# Patient Record
Sex: Female | Born: 2000
Health system: Southern US, Community
[De-identification: ages and names within clinical notes are randomized; demographics above are authoritative.]

## PROBLEM LIST (undated history)

## (undated) DIAGNOSIS — F329 Major depressive disorder, single episode, unspecified: Secondary | ICD-10-CM

## (undated) DIAGNOSIS — F32A Depression, unspecified: Secondary | ICD-10-CM

---
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
## 2021-01-12 ENCOUNTER — Emergency Department (HOSPITAL_COMMUNITY)
Admission: EM | Admit: 2021-01-12 | Discharge: 2021-01-12 | Disposition: A | Discharge status: Home / Self Care | Payer: Managed Care, Other (non HMO) | Attending: Emergency Medicine | Admitting: Emergency Medicine

## 2021-01-12 ENCOUNTER — Encounter (HOSPITAL_COMMUNITY): Payer: Self-pay | Admitting: *Deleted

## 2021-01-12 ENCOUNTER — Emergency Department (HOSPITAL_COMMUNITY): Payer: Managed Care, Other (non HMO)

## 2021-01-12 ENCOUNTER — Other Ambulatory Visit: Payer: Self-pay

## 2021-01-12 DIAGNOSIS — N12 Tubulo-interstitial nephritis, not specified as acute or chronic: Secondary | ICD-10-CM

## 2021-01-12 DIAGNOSIS — R109 Unspecified abdominal pain: Secondary | ICD-10-CM | POA: Diagnosis present

## 2021-01-12 LAB — URINALYSIS, ROUTINE W REFLEX MICROSCOPIC
Bilirubin Urine: NEGATIVE
Glucose, UA: NEGATIVE mg/dL
Ketones, ur: NEGATIVE mg/dL
Nitrite: NEGATIVE
Protein, ur: 100 mg/dL — AB
RBC / HPF: >50 RBC/hpf — ABNORMAL HIGH (ref 0–5)
Specific Gravity, Urine: 1.019 (ref 1.005–1.030)
WBC, UA: >50 WBC/hpf — ABNORMAL HIGH (ref 0–5)
pH: 6 (ref 5.0–8.0)

## 2021-01-12 LAB — I-STAT BETA HCG BLOOD, ED (MC, WL, AP ONLY): I-stat hCG, quantitative: <5 m[IU]/mL (ref <5)

## 2021-01-12 LAB — I-STAT CHEM 8, ED
BUN: 9 mg/dL (ref 6–20)
Calcium, Ion: 1.13 mmol/L — ABNORMAL LOW (ref 1.15–1.40)
Chloride: 108 mmol/L (ref 98–111)
Creatinine, Ser: 0.8 mg/dL (ref 0.44–1.00)
Glucose, Bld: 102 mg/dL — ABNORMAL HIGH (ref 70–99)
HCT: 44 % (ref 36.0–46.0)
Hemoglobin: 15 g/dL (ref 12.0–15.0)
Potassium: 4 mmol/L (ref 3.5–5.1)
Sodium: 142 mmol/L (ref 135–145)
TCO2: 23 mmol/L (ref 22–32)

## 2021-01-12 LAB — CBC
HCT: 44.9 % (ref 36.0–46.0)
Hemoglobin: 14.5 g/dL (ref 12.0–15.0)
MCH: 28.8 pg (ref 26.0–34.0)
MCHC: 32.3 g/dL (ref 30.0–36.0)
MCV: 89.3 fL (ref 80.0–100.0)
Platelets: 271 10*3/uL (ref 150–400)
RBC: 5.03 MIL/uL (ref 3.87–5.11)
RDW: 12.9 % (ref 11.5–15.5)
WBC: 11.1 10*3/uL — ABNORMAL HIGH (ref 4.0–10.5)
nRBC: 0 % (ref 0.0–0.2)

## 2021-01-12 LAB — COMPREHENSIVE METABOLIC PANEL
ALT: 20 U/L (ref 0–44)
AST: 48 U/L — ABNORMAL HIGH (ref 15–41)
Albumin: 3.8 g/dL (ref 3.5–5.0)
Alkaline Phosphatase: 76 U/L (ref 38–126)
Anion gap: 9 (ref 5–15)
BUN: 11 mg/dL (ref 6–20)
CO2: 20 mmol/L — ABNORMAL LOW (ref 22–32)
Calcium: 8.9 mg/dL (ref 8.9–10.3)
Chloride: 108 mmol/L (ref 98–111)
Creatinine, Ser: 0.85 mg/dL (ref 0.44–1.00)
GFR, Estimated: >60 mL/min (ref >60)
Glucose, Bld: 105 mg/dL — ABNORMAL HIGH (ref 70–99)
Potassium: 5.5 mmol/L — ABNORMAL HIGH (ref 3.5–5.1)
Sodium: 137 mmol/L (ref 135–145)
Total Bilirubin: 1.2 mg/dL (ref 0.3–1.2)
Total Protein: 6.9 g/dL (ref 6.5–8.1)

## 2021-01-12 LAB — LIPASE, BLOOD: Lipase: 35 U/L (ref 11–51)

## 2021-01-12 MED ORDER — SODIUM CHLORIDE 0.9 % IV SOLN
1.0000 g | Freq: Once | INTRAVENOUS | Status: AC
  Administered 2021-01-12: 1 g via INTRAVENOUS
  Filled 2021-01-12: qty 10

## 2021-01-12 MED ORDER — ONDANSETRON HCL 4 MG/2ML IJ SOLN
4.0000 mg | Freq: Once | INTRAMUSCULAR | Status: AC
  Administered 2021-01-12: 4 mg via INTRAVENOUS
  Filled 2021-01-12: qty 2

## 2021-01-12 MED ORDER — SODIUM CHLORIDE 0.9 % IV BOLUS
1000.0000 mL | Freq: Once | INTRAVENOUS | Status: AC
  Administered 2021-01-12: 1000 mL via INTRAVENOUS

## 2021-01-12 MED ORDER — MORPHINE SULFATE (PF) 4 MG/ML IV SOLN
4.0000 mg | Freq: Once | INTRAVENOUS | Status: AC
  Administered 2021-01-12: 4 mg via INTRAVENOUS
  Filled 2021-01-12: qty 1

## 2021-01-12 MED ORDER — CEPHALEXIN 500 MG PO CAPS: 1000.0000 mg | ORAL_CAPSULE | Freq: Two times a day (BID) | ORAL | 0 refills | Status: AC

## 2021-01-12 NOTE — Discharge Instructions (Addendum)
-  Prescription for Keflex sent to your pharmacy.  This antibiotic used to treat pyelonephritis.  Take as prescribed.   Follow-up with your regular doctor for symptom recheck.   Return to ER for any new or worsening symptoms.
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
## 2021-01-12 NOTE — ED Triage Notes (Signed)
The pt woke up at 0600am with rt flank pain severe pain .  Bloody urine  With urinary frequency  lmp birth control

## 2021-01-15 LAB — URINE CULTURE: Culture: >=100000 — AB

## 2021-01-16 ENCOUNTER — Telehealth: Payer: Self-pay | Admitting: *Deleted

## 2021-01-16 NOTE — Telephone Encounter (Signed)
Post ED Visit - Positive Culture Follow-up  Culture report reviewed by antimicrobial stewardship pharmacist: [facility] []  , Pharm.D. []  Enzo Bi, Pharm.D., BCPS AQ-ID []  , Pharm.D., BCPS []  Celedonio Miyamoto, Pharm.D., BCPS []  [place], Garvin Fila.D., BCPS, AAHIVP []  , Pharm.D., BCPS, AAHIVP []  Georgina Pillion, PharmD, BCPS []  , PharmD, BCPS []  [place], PharmD, BCPS []  [place], PharmD []  , PharmD, BCPS []  Estella Husk, PharmD  Pharmacy Team []  Lysle Pearl, PharmD []  , PharmD []  Phillips Climes, PharmD []  , Rph []  Agapito Games) , PharmD []  Verlan Friends, PharmD []  , PharmD []  Mervyn Gay, PharmD []  , PharmD []  Vinnie Level, PharmD []  [facility], PharmD []  , PharmD []  Len Childs, PharmD   Positive urine culture Treated with Cephalexin, organism sensitive to the same and no further patient follow-up is required at this time. , PharmD  Greer Pickerel Talley 01/16/2021, 11:09 AM

## 2023-04-26 DIAGNOSIS — Z62819 Personal history of unspecified abuse in childhood: Secondary | ICD-10-CM | POA: Diagnosis not present

## 2023-04-26 DIAGNOSIS — F332 Major depressive disorder, recurrent severe without psychotic features: Secondary | ICD-10-CM | POA: Diagnosis not present

## 2023-04-26 DIAGNOSIS — F900 Attention-deficit hyperactivity disorder, predominantly inattentive type: Secondary | ICD-10-CM | POA: Diagnosis not present

## 2023-04-26 DIAGNOSIS — F419 Anxiety disorder, unspecified: Secondary | ICD-10-CM | POA: Diagnosis not present

## 2023-06-17 DIAGNOSIS — F900 Attention-deficit hyperactivity disorder, predominantly inattentive type: Secondary | ICD-10-CM | POA: Diagnosis not present

## 2023-06-17 DIAGNOSIS — F419 Anxiety disorder, unspecified: Secondary | ICD-10-CM | POA: Diagnosis not present

## 2023-06-17 DIAGNOSIS — F332 Major depressive disorder, recurrent severe without psychotic features: Secondary | ICD-10-CM | POA: Diagnosis not present

## 2023-06-17 DIAGNOSIS — Z62819 Personal history of unspecified abuse in childhood: Secondary | ICD-10-CM | POA: Diagnosis not present

## 2023-06-17 DIAGNOSIS — Z9151 Personal history of suicidal behavior: Secondary | ICD-10-CM | POA: Diagnosis not present

## 2023-06-25 DIAGNOSIS — Z20822 Contact with and (suspected) exposure to covid-19: Secondary | ICD-10-CM | POA: Diagnosis not present

## 2023-06-25 DIAGNOSIS — J029 Acute pharyngitis, unspecified: Secondary | ICD-10-CM | POA: Diagnosis not present

## 2023-06-25 DIAGNOSIS — M940 Chondrocostal junction syndrome [Tietze]: Secondary | ICD-10-CM | POA: Diagnosis not present

## 2023-06-25 DIAGNOSIS — J069 Acute upper respiratory infection, unspecified: Secondary | ICD-10-CM | POA: Diagnosis not present

## 2023-07-15 DIAGNOSIS — Z9151 Personal history of suicidal behavior: Secondary | ICD-10-CM | POA: Diagnosis not present

## 2023-07-15 DIAGNOSIS — Z62819 Personal history of unspecified abuse in childhood: Secondary | ICD-10-CM | POA: Diagnosis not present

## 2023-07-15 DIAGNOSIS — F332 Major depressive disorder, recurrent severe without psychotic features: Secondary | ICD-10-CM | POA: Diagnosis not present

## 2023-07-15 DIAGNOSIS — F900 Attention-deficit hyperactivity disorder, predominantly inattentive type: Secondary | ICD-10-CM | POA: Diagnosis not present

## 2023-07-15 DIAGNOSIS — F419 Anxiety disorder, unspecified: Secondary | ICD-10-CM | POA: Diagnosis not present

## 2023-07-19 DIAGNOSIS — Z139 Encounter for screening, unspecified: Secondary | ICD-10-CM | POA: Diagnosis not present

## 2023-07-19 DIAGNOSIS — Z01419 Encounter for gynecological examination (general) (routine) without abnormal findings: Secondary | ICD-10-CM | POA: Diagnosis not present

## 2023-07-19 DIAGNOSIS — Z124 Encounter for screening for malignant neoplasm of cervix: Secondary | ICD-10-CM | POA: Diagnosis not present

## 2023-07-19 DIAGNOSIS — Z7689 Persons encountering health services in other specified circumstances: Secondary | ICD-10-CM | POA: Diagnosis not present

## 2023-08-31 ENCOUNTER — Encounter (HOSPITAL_COMMUNITY): Payer: Self-pay

## 2023-08-31 ENCOUNTER — Emergency Department (HOSPITAL_COMMUNITY): Payer: Medicaid Other

## 2023-08-31 ENCOUNTER — Emergency Department (HOSPITAL_COMMUNITY)
Admission: EM | Admit: 2023-08-31 | Discharge: 2023-08-31 | Disposition: A | Discharge status: Home / Self Care | Payer: Medicaid Other | Attending: Pediatric Emergency Medicine | Admitting: Pediatric Emergency Medicine

## 2023-08-31 DIAGNOSIS — S61451A Open bite of right hand, initial encounter: Secondary | ICD-10-CM | POA: Diagnosis not present

## 2023-08-31 DIAGNOSIS — M7989 Other specified soft tissue disorders: Secondary | ICD-10-CM | POA: Diagnosis not present

## 2023-08-31 DIAGNOSIS — W540XXA Bitten by dog, initial encounter: Secondary | ICD-10-CM | POA: Diagnosis not present

## 2023-08-31 DIAGNOSIS — Z23 Encounter for immunization: Secondary | ICD-10-CM | POA: Diagnosis not present

## 2023-08-31 DIAGNOSIS — S61411A Laceration without foreign body of right hand, initial encounter: Secondary | ICD-10-CM | POA: Diagnosis not present

## 2023-08-31 DIAGNOSIS — S6991XA Unspecified injury of right wrist, hand and finger(s), initial encounter: Secondary | ICD-10-CM | POA: Diagnosis present

## 2023-08-31 MED ORDER — DOXYCYCLINE HYCLATE 100 MG PO CAPS: 100.0000 mg | ORAL_CAPSULE | Freq: Two times a day (BID) | ORAL | 0 refills | Status: AC

## 2023-08-31 MED ORDER — HYDROCODONE-ACETAMINOPHEN 5-325 MG PO TABS
1.0000 | ORAL_TABLET | ORAL | Status: AC
  Administered 2023-08-31: 1 via ORAL
  Filled 2023-08-31: qty 1

## 2023-08-31 MED ORDER — TETANUS-DIPHTH-ACELL PERTUSSIS 5-2.5-18.5 LF-MCG/0.5 IM SUSY
0.5000 mL | PREFILLED_SYRINGE | Freq: Once | INTRAMUSCULAR | Status: AC
  Administered 2023-08-31: 0.5 mL via INTRAMUSCULAR
  Filled 2023-08-31: qty 0.5

## 2023-08-31 MED ORDER — DOXYCYCLINE HYCLATE 100 MG PO TABS
100.0000 mg | ORAL_TABLET | Freq: Once | ORAL | Status: AC
  Administered 2023-08-31: 100 mg via ORAL
  Filled 2023-08-31: qty 1

## 2023-08-31 MED ORDER — TETANUS-DIPHTHERIA TOXOIDS TD 5-2 LFU IM INJ: 0.5000 mL | INJECTION | Freq: Once | INTRAMUSCULAR | Status: DC

## 2023-08-31 NOTE — Discharge Instructions (Addendum)
Sutures removed in 7-10 days °

## 2023-08-31 NOTE — ED Triage Notes (Signed)
Pt is coming for an animal bite that occurred tonight, it was their neighbors [employer] [facility]. There is a single puncture wound on the top of the right hand, and a single puncture wound to the palm of the right hand. The are unsure if the dog is vaccinated, and they have already called animal control and filed a report. She is also no up to date on her tetanus.

## 2023-08-31 NOTE — ED Provider Notes (Signed)
[place] EMERGENCY DEPARTMENT AT [facility] Provider Note   CSN: [id number] Arrival date & time: 08/31/23  1751     History  Chief Complaint  Patient presents with   Animal Bite    Briana Carrillo is a 22 y.o. female healthy immunized female who was bit by neighbors dog prior to arrival.  Immediate hand pain and bleeding.  Bleeding controlled with pressure and arrives.  No other injuries.   Animal Bite      Home Medications Prior to Admission medications   Medication Sig Start Date End Date Taking? Authorizing Provider  doxycycline (VIBRAMYCIN) 100 MG capsule Take 1 capsule (100 mg total) by mouth 2 (two) times daily for 7 days. 08/31/23 09/07/23 Yes Tilman Mcclaren, Wyvonnia Dusky, MD  ibuprofen (ADVIL) 200 MG tablet Take 200 mg by mouth every 6 (six) hours as needed for fever, headache or mild pain.    [provider]  polyvinyl alcohol (LIQUIFILM TEARS) 1.4 % ophthalmic solution Place 1 drop into both eyes as needed for dry eyes.    [provider]  sertraline (ZOLOFT) 50 MG tablet Take 1 tablet (50 mg total) by mouth daily. 06/12/20   Dagar, Geralynn Rile, MD      Allergies    Lactose intolerance (gi) and Penicillins    Review of Systems   Review of Systems  All other systems reviewed and are negative.   Physical Exam Updated Vital Signs BP (!) 129/109   Pulse (!) 107   Temp 98 F (36.7 C)   Resp 20   SpO2 100%  Physical Exam Vitals and nursing note reviewed.  Constitutional:      General: She is not in acute distress.    Appearance: She is not ill-appearing.  HENT:     Mouth/Throat:     Mouth: Mucous membranes are moist.  Cardiovascular:     Rate and Rhythm: Normal rate.     Pulses: Normal pulses.  Pulmonary:     Effort: Pulmonary effort is normal.  Abdominal:     Tenderness: There is no abdominal tenderness.  Musculoskeletal:        General: Swelling and tenderness present.     Comments: 1.5 cm laceration to the dorsum of her right hand  oozing and gaping as well as a 1 cm laceration to the palmar surface of her hand that continues to ooze and is gaping  Skin:    General: Skin is warm.     Capillary Refill: Capillary refill takes less than 2 seconds.  Neurological:     General: No focal deficit present.     Mental Status: She is alert.  Psychiatric:        Behavior: Behavior normal.     ED Results / Procedures / Treatments   Labs (all labs ordered are listed, but only abnormal results are displayed) Labs Reviewed - No data to display  EKG None  Radiology DG Hand 2 View Right  Result Date: 08/31/2023 CLINICAL DATA:  Status post dog bite. EXAM: RIGHT HAND - 2 VIEW COMPARISON:  None Available. FINDINGS: There is no evidence of fracture or dislocation. There is no evidence of arthropathy or other focal bone abnormality. Moderate severity soft tissue swelling is seen along the dorsal aspect of the right hand. Additional small superficial soft tissue defect is seen within this region. No radiopaque soft tissue foreign bodies are identified. IMPRESSION: Moderate severity dorsal soft tissue swelling. Electronically Signed   By: Aram Candela M.D.   On:  08/31/2023 19:04    Procedures .Laceration Repair  Date/Time: 08/31/2023 10:24 PM  Performed by: Charlett Nose, MD Authorized by: Charlett Nose, MD   Consent:    Consent obtained:  Verbal   Consent given by:  Patient   Risks discussed:  Infection, pain and poor cosmetic result Laceration details:    Location:  Hand   Length (cm):  2.5   Depth (mm):  3 Exploration:    Hemostasis achieved with:  Direct pressure   Imaging obtained: x-ray     Wound exploration: wound explored through full range of motion and entire depth of wound visualized     Contaminated: yes   Treatment:    Area cleansed with:  Shur-Clens   Irrigation solution:  Sterile saline Skin repair:    Repair method:  Sutures   Suture size:  4-0   Suture material:  Prolene   Suture  technique:  Simple interrupted   Number of sutures:  3 Approximation:    Approximation:  Loose Post-procedure details:    Dressing:  Antibiotic ointment and bulky dressing   Procedure completion:  Tolerated well, no immediate complications     Medications Ordered in ED Medications  HYDROcodone-acetaminophen (NORCO/VICODIN) 5-325 MG per tablet 1 tablet (1 tablet Oral Given 08/31/23 1827)  Tdap (BOOSTRIX) injection 0.5 mL (0.5 mLs Intramuscular Given 08/31/23 2105)  doxycycline (VIBRA-TABS) tablet 100 mg (100 mg Oral Given 08/31/23 2215)    ED Course/ Medical Decision Making/ A&P                                 Medical Decision Making Amount and/or Complexity of Data Reviewed External Data Reviewed: notes.  Risk Prescription drug management.   Patient is overall well appearing with symptoms consistent with an animal bite.  Exam notable for gaping and oozing laceration to the dorsal and palmar surface of the right hand.  X-ray obtained that showed no bony injury or retained foreign body.  Good cap refill able to wiggle fingers and normal sensation distal to injury.  Doubt nerve or vascular injury at this time.  Copious wound irrigation with Shur-Clens and with gaping oozing nature stitches were placed to loosely approximate wounds.  Amoxicillin allergy with throat swelling and extensive rash and so initiated therapy with doxycycline and first dose provided here.  Placed nonabsorbable sutures for wound reevaluation and provided prescription for doxycycline.  Return precautions discussed with family prior to discharge and they were advised to follow with pcp as needed if symptoms worsen or fail to improve.         Final Clinical Impression(s) / ED Diagnoses Final diagnoses:  Dog bite of right hand, initial encounter    Rx / DC Orders ED Discharge Orders          Ordered    doxycycline (VIBRAMYCIN) 100 MG capsule  2 times daily        08/31/23 2139               Charlett Nose, MD 08/31/23 2227

## 2023-09-09 ENCOUNTER — Ambulatory Visit
Admission: EM | Admit: 2023-09-09 | Discharge: 2023-09-09 | Disposition: A | Discharge status: Home / Self Care | Payer: Medicaid Other | Attending: Family Medicine | Admitting: Family Medicine

## 2023-09-09 DIAGNOSIS — Z4802 Encounter for removal of sutures: Secondary | ICD-10-CM

## 2023-09-09 NOTE — ED Triage Notes (Signed)
Here for suture removal s/p dog bite on 12/10. Seen in ED Sutures present to right hand (3 total per pt)

## 2023-09-09 NOTE — ED Provider Notes (Signed)
  [facility]   [id number] 09/09/23 Arrival Time: 0934  ASSESSMENT & PLAN:  1. Encounter for removal of sutures    Here for suture removal. Placed in ED 08/31/23 s/p dog bite. Note reviewed. Sutures removed by RN. Wounds have healed well. No signs of infection. See AVS for after-care instructions.   Follow-up Information     [facility]).   Specialty: Urgent Care Why: As needed. Contact information: [address] [place] [address] [phone]                Reviewed expectations re: course of current medical issues. Questions answered. Outlined signs and symptoms indicating need for more acute intervention. Understanding verbalized. After Visit Summary given.    Mardella Layman, MD 09/09/23 [phone]

## 2023-09-10 ENCOUNTER — Ambulatory Visit: Payer: Medicaid Other

## 2023-10-13 DIAGNOSIS — F32A Depression, unspecified: Secondary | ICD-10-CM | POA: Diagnosis not present

## 2023-10-13 DIAGNOSIS — F419 Anxiety disorder, unspecified: Secondary | ICD-10-CM | POA: Diagnosis not present

## 2023-10-13 DIAGNOSIS — Z0001 Encounter for general adult medical examination with abnormal findings: Secondary | ICD-10-CM | POA: Diagnosis not present

## 2023-10-29 DIAGNOSIS — Z3046 Encounter for surveillance of implantable subdermal contraceptive: Secondary | ICD-10-CM | POA: Diagnosis not present

## 2023-12-31 DIAGNOSIS — N3001 Acute cystitis with hematuria: Secondary | ICD-10-CM | POA: Diagnosis not present
